# Patient Record
Sex: Female | Born: 1985 | Race: White | Hispanic: No | Marital: Single | Smoking: Light tobacco smoker
Health system: Southern US, Community
[De-identification: ages and names within clinical notes are randomized; demographics above are authoritative.]

## PROBLEM LIST (undated history)

## (undated) DIAGNOSIS — F909 Attention-deficit hyperactivity disorder, unspecified type: Secondary | ICD-10-CM

---

## 2013-09-05 ENCOUNTER — Emergency Department (HOSPITAL_BASED_OUTPATIENT_CLINIC_OR_DEPARTMENT_OTHER)
Admission: EM | Admit: 2013-09-05 | Discharge: 2013-09-05 | Disposition: A | Discharge status: Home / Self Care | Payer: 59 | Attending: Emergency Medicine | Admitting: Emergency Medicine

## 2013-09-05 ENCOUNTER — Encounter (HOSPITAL_BASED_OUTPATIENT_CLINIC_OR_DEPARTMENT_OTHER): Payer: Self-pay | Admitting: Emergency Medicine

## 2013-09-05 DIAGNOSIS — Z91040 Latex allergy status: Secondary | ICD-10-CM | POA: Insufficient documentation

## 2013-09-05 DIAGNOSIS — Y9389 Activity, other specified: Secondary | ICD-10-CM | POA: Insufficient documentation

## 2013-09-05 DIAGNOSIS — S61012A Laceration without foreign body of left thumb without damage to nail, initial encounter: Secondary | ICD-10-CM

## 2013-09-05 DIAGNOSIS — S61209A Unspecified open wound of unspecified finger without damage to nail, initial encounter: Secondary | ICD-10-CM | POA: Insufficient documentation

## 2013-09-05 DIAGNOSIS — F909 Attention-deficit hyperactivity disorder, unspecified type: Secondary | ICD-10-CM | POA: Insufficient documentation

## 2013-09-05 DIAGNOSIS — Y929 Unspecified place or not applicable: Secondary | ICD-10-CM | POA: Insufficient documentation

## 2013-09-05 DIAGNOSIS — W298XXA Contact with other powered powered hand tools and household machinery, initial encounter: Secondary | ICD-10-CM | POA: Insufficient documentation

## 2013-09-05 DIAGNOSIS — Z79899 Other long term (current) drug therapy: Secondary | ICD-10-CM | POA: Insufficient documentation

## 2013-09-05 HISTORY — DX: Attention-deficit hyperactivity disorder, unspecified type: F90.9

## 2013-09-05 MED ORDER — TETANUS-DIPHTH-ACELL PERTUSSIS 5-2.5-18.5 LF-MCG/0.5 IM SUSP
0.5000 mL | Freq: Once | INTRAMUSCULAR | Status: DC
  Filled 2013-09-05: qty 0.5

## 2013-09-05 NOTE — ED Provider Notes (Signed)
CSN: [id number]     Arrival date & time 09/05/13  2028 History   First MD Initiated Contact with Patient 09/05/13 2050     Chief Complaint  Patient presents with  . Extremity Laceration   (Consider location/radiation/quality/duration/timing/severity/associated sxs/prior Treatment) Patient is a 27 y.o. female presenting with skin laceration. The history is provided by the patient. No language interpreter was used.  Laceration Location:  Hand Hand laceration location:  L hand Depth:  Cutaneous Time since incident:  2 hours Laceration mechanism:  Razor Pain details:    Severity:  Mild Foreign body present:  No foreign bodies Tetanus status:  Out of date   Past Medical History  Diagnosis Date  . ADHD (attention deficit hyperactivity disorder)    Past Surgical History  Procedure Laterality Date  . Cesarean section     History reviewed. No pertinent family history. History  Substance Use Topics  . Smoking status: Never Smoker   . Smokeless tobacco: Not on file  . Alcohol Use: No   OB History   Grav Para Term Preterm Abortions TAB SAB Ect Mult Living                 Review of Systems  Skin: Positive for wound.  Neurological: Negative for numbness.    Allergies  Latex  Home Medications   Current Outpatient Rx  Name  Route  Sig  Dispense  Refill  . amphetamine-dextroamphetamine (ADDERALL) 15 MG tablet   Oral   Take 15 mg by mouth daily.          BP 138/89  Pulse 104  Temp(Src) 98.9 F (37.2 C)  Resp 18  Ht 5\' 4"  (1.626 m)  Wt 160 lb (72.576 kg)  BMI 27.45 kg/m2  SpO2 100%  LMP 08/11/2013 Physical Exam  Constitutional: She is oriented to person, place, and time. She appears well-developed and well-nourished.  Neck: Normal range of motion.  Pulmonary/Chest: Effort normal.  Musculoskeletal:  FROM thumb, full strength.  Neurological: She is alert and oriented to person, place, and time.  Skin: Skin is warm and dry.  2.5 cm laceration to palmar aspect  left thumb just distal to MCP joint.     ED Course  Procedures (including critical care time) Labs Review Labs Reviewed - No data to display Imaging Review No results found.  EKG Interpretation   None       MDM  No diagnosis found. 1. Laceration left thumb  LACERATION REPAIR Performed by: Elpidio Anis A Authorized by: Elpidio Anis A Consent: Verbal consent obtained. Risks and benefits: risks, benefits and alternatives were discussed Consent given by: patient Patient identity confirmed: provided demographic data Prepped and Draped in normal sterile fashion Wound explored  Laceration Location: left thumb  Laceration Length: 2.5cm  No Foreign Bodies seen or palpated  Anesthesia: local infiltration  Local anesthetic: lidocaine 2% w/o epinephrine  Anesthetic total: 2 ml  Irrigation method: syringe Amount of cleaning: standard  Skin closure: 4-0 Prolene  Number of sutures: 4  Technique: simple interrupted.  Patient tolerance: Patient tolerated the procedure well with no immediate complications.     Arnoldo Hooker, PA-C 09/05/13 2208

## 2013-09-05 NOTE — ED Notes (Signed)
1 inch lac to base of left thumb w box cutter while cutting cardboard   Bleeding controlled w pressure

## 2013-09-05 NOTE — ED Notes (Signed)
Pt c/o laceration left thumb with box cutter x 1 hr ago

## 2013-09-05 NOTE — ED Provider Notes (Signed)
Medical screening examination/treatment/procedure(s) were performed by non-physician practitioner and as supervising physician I was immediately available for consultation/collaboration.  EKG Interpretation   None         Yassen Kinnett, MD 09/05/13 2318 

## 2019-02-27 ENCOUNTER — Encounter (HOSPITAL_BASED_OUTPATIENT_CLINIC_OR_DEPARTMENT_OTHER): Payer: Self-pay | Admitting: *Deleted

## 2019-02-27 ENCOUNTER — Emergency Department (HOSPITAL_BASED_OUTPATIENT_CLINIC_OR_DEPARTMENT_OTHER): Payer: 59

## 2019-02-27 ENCOUNTER — Other Ambulatory Visit: Payer: Self-pay

## 2019-02-27 ENCOUNTER — Emergency Department (HOSPITAL_BASED_OUTPATIENT_CLINIC_OR_DEPARTMENT_OTHER)
Admission: EM | Admit: 2019-02-27 | Discharge: 2019-02-27 | Disposition: A | Discharge status: Home / Self Care | Payer: 59 | Attending: Emergency Medicine | Admitting: Emergency Medicine

## 2019-02-27 DIAGNOSIS — F17210 Nicotine dependence, cigarettes, uncomplicated: Secondary | ICD-10-CM | POA: Insufficient documentation

## 2019-02-27 DIAGNOSIS — Y9289 Other specified places as the place of occurrence of the external cause: Secondary | ICD-10-CM | POA: Diagnosis not present

## 2019-02-27 DIAGNOSIS — Y9301 Activity, walking, marching and hiking: Secondary | ICD-10-CM | POA: Diagnosis not present

## 2019-02-27 DIAGNOSIS — W01198A Fall on same level from slipping, tripping and stumbling with subsequent striking against other object, initial encounter: Secondary | ICD-10-CM | POA: Insufficient documentation

## 2019-02-27 DIAGNOSIS — Y998 Other external cause status: Secondary | ICD-10-CM | POA: Insufficient documentation

## 2019-02-27 DIAGNOSIS — Z23 Encounter for immunization: Secondary | ICD-10-CM | POA: Insufficient documentation

## 2019-02-27 DIAGNOSIS — S81812A Laceration without foreign body, left lower leg, initial encounter: Secondary | ICD-10-CM | POA: Insufficient documentation

## 2019-02-27 MED ORDER — BACITRACIN ZINC 500 UNIT/GM EX OINT: TOPICAL_OINTMENT | Freq: Once | CUTANEOUS | Status: DC

## 2019-02-27 MED ORDER — HYDROCODONE-ACETAMINOPHEN 5-325 MG PO TABS
2.0000 | ORAL_TABLET | Freq: Once | ORAL | Status: AC
  Administered 2019-02-27: 2 via ORAL
  Filled 2019-02-27: qty 2

## 2019-02-27 MED ORDER — LIDOCAINE-EPINEPHRINE (PF) 2 %-1:200000 IJ SOLN
10.0000 mL | Freq: Once | INTRAMUSCULAR | Status: AC
  Administered 2019-02-27: 10 mL via INTRADERMAL
  Filled 2019-02-27 (×2): qty 10

## 2019-02-27 MED ORDER — TETANUS-DIPHTH-ACELL PERTUSSIS 5-2.5-18.5 LF-MCG/0.5 IM SUSP
0.5000 mL | Freq: Once | INTRAMUSCULAR | Status: AC
  Administered 2019-02-27: 0.5 mL via INTRAMUSCULAR
  Filled 2019-02-27: qty 0.5

## 2019-02-27 NOTE — ED Notes (Signed)
Dressing applied to sutures.  

## 2019-02-27 NOTE — Discharge Instructions (Signed)
Keep the wound clean and dry for the first 24 hours. After that you may gently clean the wound with soap and water. Make sure to pat dry the wound before covering it with any dressing. You can use topical antibiotic ointment and bandage. Ice and elevate for pain relief.  ° °You can take Tylenol or Ibuprofen as directed for pain. You can alternate Tylenol and Ibuprofen every 4 hours for additional pain relief.  ° °Return to the Emergency Department, your primary care doctor, or the [facility] in 7-10 days for suture removal.  ° °Monitor closely for any signs of infection. Return to the Emergency Department for any worsening redness/swelling of the area that begins to spread, drainage from the site, worsening pain, fever or any other worsening or concerning symptoms.  ° ° °

## 2019-02-27 NOTE — ED Provider Notes (Signed)
[facility] EMERGENCY DEPARTMENT Provider Note   CSN: [id number] Arrival date & time: 02/27/19  2012    History   Chief Complaint Chief Complaint  Patient presents with  . Extremity Laceration    HPI Denise Pope is a 33 y.o. female who presents for evaluation of laceration noted in the anterior aspect of left lower extremity that occurred about 30 minutes prior to ED arrival.  Patient reports that she was stepping off a patio and slipped and reports falling on the corner of a slight ledge.  She states that this caused a laceration to the anterior aspect of her left lower leg.  Patient states that she has been able to ambulate and bear weight since the incident.  She has been taking ibuprofen with minimal improvement in her pain.  She does not know when her last tetanus shot was.  Patient denies any head injury, LOC.  She denies any numbness/weakness.     The history is provided by the patient.    Past Medical History:  Diagnosis Date  . ADHD (attention deficit hyperactivity disorder)     There are no active problems to display for this patient.   Past Surgical History:  Procedure Laterality Date  . CESAREAN SECTION       OB History   No obstetric history on file.      Home Medications    Prior to Admission medications   Medication Sig Start Date End Date Taking? Authorizing Provider  amphetamine-dextroamphetamine (ADDERALL) 15 MG tablet Take 15 mg by mouth daily.    [provider]    Family History No family history on file.  Social History Social History   Tobacco Use  . Smoking status: Light Tobacco Smoker  . Smokeless tobacco: Never Used  Substance Use Topics  . Alcohol use: Yes    Comment: rare  . Drug use: No     Allergies   Latex   Review of Systems Review of Systems  Skin: Positive for wound.  Neurological: Negative for weakness and numbness.  All other systems reviewed and are negative.    Physical Exam Updated  Vital Signs BP 119/88 (BP Location: Right Arm)   Pulse 90   Temp 98 F (36.7 C) (Oral)   Resp 18   Ht 5\' 4"  (1.626 m)   Wt 92.5 kg   LMP 02/04/2019   SpO2 100%   BMI 35.02 kg/m   Physical Exam Vitals signs and nursing note reviewed.  Constitutional:      Appearance: She is well-developed.  HENT:     Head: Normocephalic and atraumatic.  Eyes:     General: No scleral icterus.       Right eye: No discharge.        Left eye: No discharge.     Conjunctiva/sclera: Conjunctivae normal.  Cardiovascular:     Pulses:          Dorsalis pedis pulses are 2+ on the right side and 2+ on the left side.  Pulmonary:     Effort: Pulmonary effort is normal.  Musculoskeletal:     Comments: 4 cm V-shaped wound noted to the anterior aspect of left lower extremity just distal to the knee.  Mild tenderness surrounding the area.  No deformity or crepitus noted.  Skin:    General: Skin is warm and dry.     Capillary Refill: Capillary refill takes less than 2 seconds.     Comments: Good distal cap refill. LLE is not  dusky in appearance or cool to touch.  Neurological:     Mental Status: She is alert.     Comments: Sensation intact along major nerve distributions of LLE  Psychiatric:        Speech: Speech normal.        Behavior: Behavior normal.         ED Treatments / Results  Labs (all labs ordered are listed, but only abnormal results are displayed) Labs Reviewed - No data to display  EKG None  Radiology Dg Tibia/fibula Left  Result Date: 02/27/2019 CLINICAL DATA:  Fall, left leg pain, shin laceration EXAM: LEFT TIBIA AND FIBULA - 2 VIEW COMPARISON:  None. FINDINGS: Soft tissue defect noted over the anterior upper shin. No radiopaque foreign body. No acute bony abnormality. Specifically, no fracture, subluxation, or dislocation. IMPRESSION: No fracture or foreign body. Electronically Signed   By: Charlett NoseKevin  Dover M.D.   On: 02/27/2019 21:16    Procedures .Marland Kitchen.Laceration Repair   Date/Time: 02/27/2019 10:20 PM Performed by: Maxwell CaulLayden, Vaughn Beaumier A, PA-C Authorized by: Maxwell CaulLayden, Bracha Frankowski A, PA-C   Consent:    Consent obtained:  Verbal   Consent given by:  Patient   Risks discussed:  Infection, need for additional repair, pain, poor cosmetic result and poor wound healing   Alternatives discussed:  No treatment and delayed treatment Universal protocol:    Procedure explained and questions answered to patient or proxy's satisfaction: yes     Relevant documents present and verified: yes     Test results available and properly labeled: yes     Imaging studies available: yes     Required blood products, implants, devices, and special equipment available: yes     Site/side marked: yes     Immediately prior to procedure, a time out was called: yes     Patient identity confirmed:  Verbally with patient Anesthesia (see MAR for exact dosages):    Anesthesia method:  Local infiltration Laceration details:    Location:  Leg   Leg location:  L lower leg   Length (cm):  4 Repair type:    Repair type:  Intermediate Pre-procedure details:    Preparation:  Patient was prepped and draped in usual sterile fashion Exploration:    Hemostasis achieved with:  Direct pressure   Wound exploration: wound explored through full range of motion     Wound extent: no foreign bodies/material noted and no muscle damage noted   Treatment:    Area cleansed with:  Betadine   Amount of cleaning:  Extensive   Irrigation solution:  Sterile saline   Irrigation method:  Pressure wash   Visualized foreign bodies/material removed: no   Skin repair:    Repair method:  Sutures   Suture size:  3-0   Suture material:  Nylon   Suture technique:  Simple interrupted   Number of sutures:  10 Approximation:    Approximation:  Close Post-procedure details:    Dressing:  Antibiotic ointment   Patient tolerance of procedure:  Tolerated well, no immediate complications Comments:     Once the wound was  anesthetized, it was thoroughly and extensively irrigated with sterile saline.  Irrigation of the wound showed no evidence of foreign bodies.  Surrounding area was sterilized with Betadine and sterile drapes again thoroughly and extensively irrigated.  On evaluation of the wound, there appeared to be no muscle damage.  The medial aspect of the wound, there was subcutaneous fat missing.  No defect into muscle or defect  further into the leg space.  No foreign bodies, debris noted into the wound.  The wound was again thoroughly extensively irrigated before being repaired.   (including critical care time)  Medications Ordered in ED Medications  bacitracin ointment (has no administration in time range)  Tdap (BOOSTRIX) injection 0.5 mL (0.5 mLs Intramuscular Given 02/27/19 2044)  HYDROcodone-acetaminophen (NORCO/VICODIN) 5-325 MG per tablet 2 tablet (2 tablets Oral Given 02/27/19 2044)  lidocaine-EPINEPHrine (XYLOCAINE W/EPI) 2 %-1:200000 (PF) injection 10 mL (10 mLs Intradermal Given by Other 02/27/19 2114)     Initial Impression / Assessment and Plan / ED Course  I have reviewed the triage vital signs and the nursing notes.  Pertinent labs & imaging results that were available during my care of the patient were reviewed by me and considered in my medical decision making (see chart for details).        33 year old female who presents for evaluation of left leg laceration that occurred just prior to ED arrival.  Reports that she was walking on a patio when she slipped and hit her leg on the corner sleep.  No head injury or LOC.  Has been able to ambulate.  Last tetanus greater than 5 years.  No numbness/weakness. Patient is afebrile, non-toxic appearing, sitting comfortably on examination table. Vital signs reviewed and stable. Patient is neurovascularly intact.  She has a 4 cm V-shaped laceration noted to the anterior left leg.  Plan for imaging, tetanus, wound care.  X-ray reviewed.  Negative for  any acute bony abnormality.  Laceration repaired as documented above.  Patient tolerated procedure well.  When that wound has been exposed for short amount of time and no debris found in wound as well as extensive and thorough irrigation, no indication for antibiotics at this time.  Encouraged at home supportive care measures. At this time, patient exhibits no emergent life-threatening condition that require further evaluation in ED or admission. Patient had ample opportunity for questions and discussion. All patient's questions were answered with full understanding. Strict return precautions discussed. Patient expresses understanding and agreement to plan.   Portions of this note were generated with [employer]. Dictation errors may occur despite best attempts at proofreading.   Final Clinical Impressions(s) / ED Diagnoses   Final diagnoses:  Laceration of left lower extremity, initial encounter    ED Discharge Orders    None       Desma Mcgregor 02/27/19 2224    Sherwood Gambler, MD 02/27/19 2258

## 2019-02-27 NOTE — ED Notes (Signed)
ED Provider at bedside. 

## 2019-02-27 NOTE — ED Triage Notes (Signed)
Slipped on a slate patio and cut her anterior left leg

## 2020-04-22 IMAGING — DX LEFT TIBIA AND FIBULA - 2 VIEW
2 series · 2 of 2 positions shown · non-contrast
Comparison: None.

CLINICAL DATA: Fall, left leg pain, shin laceration

EXAM:
LEFT TIBIA AND FIBULA - 2 VIEW

[tibia ap]
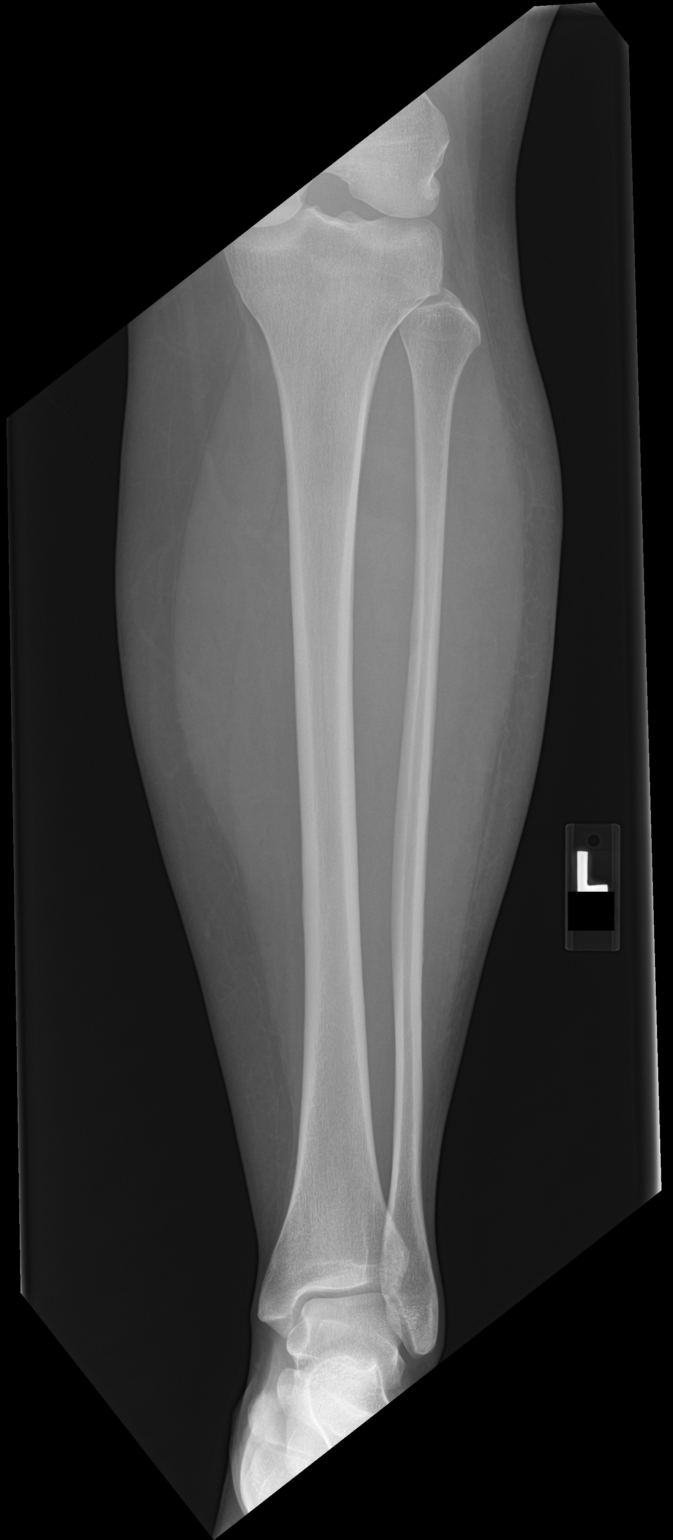

[tibia lat]
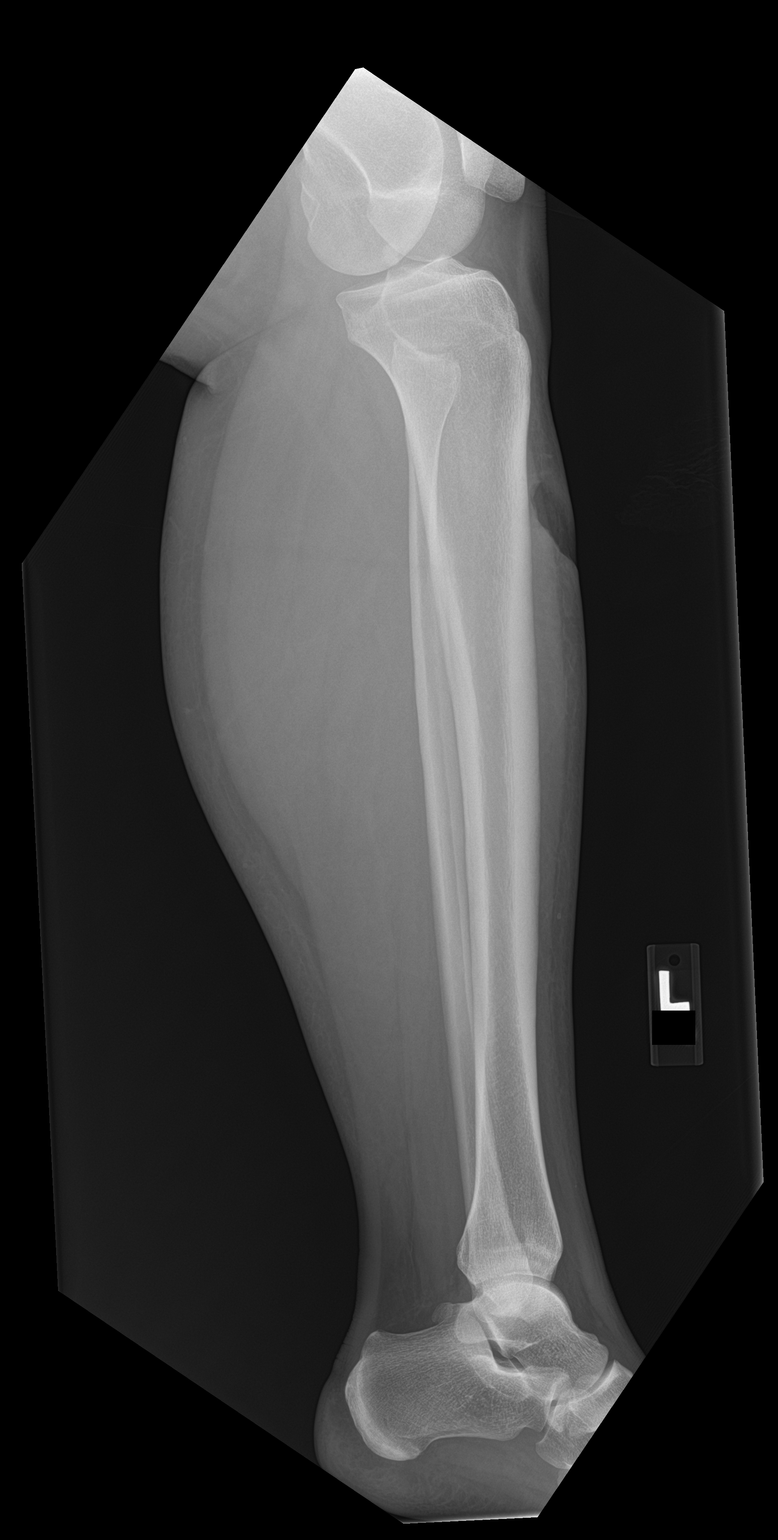

[2 of 2 positions shown; findings below may reference images not displayed]

FINDINGS: Soft tissue defect noted over the anterior upper shin. No radiopaque
foreign body. No acute bony abnormality. Specifically, no fracture,
subluxation, or dislocation.
IMPRESSION: No fracture or foreign body.
